# Patient Record
Sex: Female | Born: 1980 | Race: White | Hispanic: No | Marital: Single | Smoking: Former smoker
Health system: Southern US, Community
[De-identification: ages and names within clinical notes are randomized; demographics above are authoritative.]

## PROBLEM LIST (undated history)

## (undated) DIAGNOSIS — J45909 Unspecified asthma, uncomplicated: Secondary | ICD-10-CM

## (undated) DIAGNOSIS — R062 Wheezing: Secondary | ICD-10-CM

## (undated) DIAGNOSIS — R0600 Dyspnea, unspecified: Secondary | ICD-10-CM

## (undated) DIAGNOSIS — M1990 Unspecified osteoarthritis, unspecified site: Secondary | ICD-10-CM

## (undated) DIAGNOSIS — B029 Zoster without complications: Secondary | ICD-10-CM

## (undated) HISTORY — PX: KNEE ARTHROSCOPY W/ ACL RECONSTRUCTION: SHX1858

## (undated) HISTORY — PX: HERNIA REPAIR: SHX51

## (undated) HISTORY — PX: MENISECTOMY: SHX5181

## (undated) HISTORY — PX: TUBAL LIGATION: SHX77

---

## 2017-03-20 ENCOUNTER — Encounter (HOSPITAL_COMMUNITY): Payer: Self-pay | Admitting: Emergency Medicine

## 2017-03-20 ENCOUNTER — Emergency Department (HOSPITAL_COMMUNITY): Payer: Medicaid Other

## 2017-03-20 ENCOUNTER — Emergency Department (HOSPITAL_COMMUNITY)
Admission: EM | Admit: 2017-03-20 | Discharge: 2017-03-20 | Disposition: A | Discharge status: Home / Self Care | Payer: Medicaid Other | Attending: Emergency Medicine | Admitting: Emergency Medicine

## 2017-03-20 DIAGNOSIS — Y929 Unspecified place or not applicable: Secondary | ICD-10-CM | POA: Insufficient documentation

## 2017-03-20 DIAGNOSIS — S4991XA Unspecified injury of right shoulder and upper arm, initial encounter: Secondary | ICD-10-CM | POA: Diagnosis present

## 2017-03-20 DIAGNOSIS — Z91040 Latex allergy status: Secondary | ICD-10-CM | POA: Insufficient documentation

## 2017-03-20 DIAGNOSIS — S43401A Unspecified sprain of right shoulder joint, initial encounter: Secondary | ICD-10-CM | POA: Diagnosis not present

## 2017-03-20 DIAGNOSIS — Y9316 Activity, rowing, canoeing, kayaking, rafting and tubing: Secondary | ICD-10-CM | POA: Insufficient documentation

## 2017-03-20 DIAGNOSIS — J45909 Unspecified asthma, uncomplicated: Secondary | ICD-10-CM | POA: Insufficient documentation

## 2017-03-20 DIAGNOSIS — Y999 Unspecified external cause status: Secondary | ICD-10-CM | POA: Insufficient documentation

## 2017-03-20 DIAGNOSIS — W228XXA Striking against or struck by other objects, initial encounter: Secondary | ICD-10-CM | POA: Diagnosis not present

## 2017-03-20 DIAGNOSIS — Z79899 Other long term (current) drug therapy: Secondary | ICD-10-CM | POA: Diagnosis not present

## 2017-03-20 HISTORY — DX: Unspecified asthma, uncomplicated: J45.909

## 2017-03-20 MED ORDER — NAPROXEN 500 MG PO TABS: 500.0000 mg | ORAL_TABLET | Freq: Two times a day (BID) | ORAL | 0 refills | Status: DC

## 2017-03-20 MED ORDER — HYDROCODONE-ACETAMINOPHEN 5-325 MG PO TABS: 1.0000 | ORAL_TABLET | ORAL | 0 refills | Status: DC | PRN
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
## 2017-03-20 NOTE — Discharge Instructions (Signed)
Rest your shoulder as much as possible by using the sling provided.  However it is important to make small circles with your shoulder throughout the day to avoid stiffness.  Heat and ice as discussed.  Use the medications as prescribed.  You are taking naproxen in place of your diclofenac. You may take the hydrocodone prescribed for pain relief.  This will make you drowsy - do not drive within 4 hours of taking this medication.

## 2017-03-20 NOTE — ED Notes (Signed)
Pt made aware to return if symptoms worsen or if any life threatening symptoms occur.   
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
## 2017-03-28 ENCOUNTER — Encounter: Payer: Self-pay | Admitting: Orthopaedic Surgery

## 2017-03-28 ENCOUNTER — Ambulatory Visit (INDEPENDENT_AMBULATORY_CARE_PROVIDER_SITE_OTHER): Payer: Medicaid Other | Admitting: Orthopaedic Surgery

## 2017-03-28 VITALS — BP 114/70 | HR 77 | Temp 98.3°F | Ht 67.0 in | Wt 189.0 lb

## 2017-03-28 DIAGNOSIS — M25511 Pain in right shoulder: Secondary | ICD-10-CM | POA: Diagnosis not present

## 2017-03-28 MED ORDER — NAPROXEN 500 MG PO TABS: 500.0000 mg | ORAL_TABLET | Freq: Two times a day (BID) | ORAL | 5 refills | Status: DC

## 2017-03-28 MED ORDER — HYDROCODONE-ACETAMINOPHEN 5-325 MG PO TABS: ORAL_TABLET | ORAL | 0 refills | Status: DC
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
## 2017-04-11 ENCOUNTER — Encounter: Payer: Self-pay | Admitting: Orthopaedic Surgery

## 2017-04-11 ENCOUNTER — Ambulatory Visit (INDEPENDENT_AMBULATORY_CARE_PROVIDER_SITE_OTHER): Payer: Medicaid Other | Admitting: Orthopaedic Surgery

## 2017-04-11 VITALS — BP 124/80 | HR 67 | Temp 98.6°F | Ht 67.0 in | Wt 190.0 lb

## 2017-04-11 DIAGNOSIS — M25511 Pain in right shoulder: Secondary | ICD-10-CM
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
## 2017-05-01 ENCOUNTER — Ambulatory Visit (INDEPENDENT_AMBULATORY_CARE_PROVIDER_SITE_OTHER): Payer: Medicaid Other | Admitting: Orthopaedic Surgery

## 2017-05-01 ENCOUNTER — Encounter: Payer: Self-pay | Admitting: Orthopaedic Surgery

## 2017-05-01 DIAGNOSIS — G8929 Other chronic pain: Secondary | ICD-10-CM | POA: Diagnosis not present

## 2017-05-01 DIAGNOSIS — M25511 Pain in right shoulder: Secondary | ICD-10-CM | POA: Diagnosis not present

## 2017-05-01 NOTE — Progress Notes (Signed)
Patient Marcia Joseph, female DOB:1981-05-20, 36 y.o. [id number]  Chief Complaint  Patient presents with  . Follow-up    Right Shoulder pain    HPI  Marcia Joseph is a 36 y.o. female who has continued pain of the right shoulder.  She has no new trauma.  She is slightly better and has slightly better motion and no numbness but she still hurts and lacks full motion.  I will get MRI of the right shoulder.  I am concerned about rotator cuff tear. HPI  Body mass index is 30.23 kg/m.  ROS  Review of Systems  HENT: Negative for congestion.   Respiratory: Negative for cough and shortness of breath.   Cardiovascular: Negative for chest pain and leg swelling.  Endocrine: Negative for cold intolerance.  Musculoskeletal: Positive for arthralgias.  Allergic/Immunologic: Negative for environmental allergies.    Past Medical History:  Diagnosis Date  . Asthma     Past Surgical History:  Procedure Laterality Date  . HERNIA REPAIR    . KNEE ARTHROSCOPY W/ ACL RECONSTRUCTION Left   . TUBAL LIGATION      Family History  Problem Relation Age of Onset  . Heart disease Mother   . Cancer Father     Social History Social History  Substance Use Topics  . Smoking status: Never Smoker  . Smokeless tobacco: Never Used  . Alcohol use Yes     Comment: occ    Allergies  Allergen Reactions  . Latex Hives    Current Outpatient Prescriptions  Medication Sig Dispense Refill  . HYDROcodone-acetaminophen (NORCO/VICODIN) 5-325 MG tablet One tablet every four hours as needed for acute pain.  Limit of five days per [place]. 30 tablet 0  . naproxen (NAPROSYN) 500 MG tablet Take 1 tablet (500 mg total) by mouth 2 (two) times daily with a meal. 60 tablet 5   No current facility-administered medications for this visit.      Physical Exam  Blood pressure 123/84, pulse 72, temperature 99.2 F (37.3 C), height 5\' 7"  (1.702 m), weight 193 lb (87.5 kg).  Constitutional: overall normal  hygiene, normal nutrition, well developed, normal grooming, normal body habitus. Assistive device:none  Musculoskeletal: gait and station Limp none, muscle tone and strength are normal, no tremors or atrophy is present.  .  Neurological: coordination overall normal.  Deep tendon reflex/nerve stretch intact.  Sensation normal.  Cranial nerves II-XII intact.   Skin:   Normal overall no scars, lesions, ulcers or rashes. No psoriasis.  Psychiatric: Alert and oriented x 3.  Recent memory intact, remote memory unclear.  Normal mood and affect. Well groomed.  Good eye contact.  Cardiovascular: overall no swelling, no varicosities, no edema bilaterally, normal temperatures of the legs and arms, no clubbing, cyanosis and good capillary refill.  Lymphatic: palpation is normal.  Examination of right Upper Extremity is done.  Inspection:   Overall:  Elbow non-tender without crepitus or defects, forearm non-tender without crepitus or defects, wrist non-tender without crepitus or defects, hand non-tender.    Shoulder: with glenohumeral joint tenderness, without effusion.   Upper arm: without swelling and tenderness   Range of motion:   Overall:  Full range of motion of the elbow, full range of motion of wrist and full range of motion in fingers.   Shoulder:  right  165 degrees forward flexion; 150 degrees abduction; 30 degrees internal rotation, 30 degrees external rotation, 15 degrees extension, 40 degrees adduction.   Stability:   Overall:  Shoulder, elbow  and wrist stable   Strength and Tone:   Overall full shoulder muscles strength, full upper arm strength and normal upper arm bulk and tone.   The patient has been educated about the nature of the problem(s) and counseled on treatment options.  The patient appeared to understand what I have discussed and is in agreement with it.  No diagnosis found.  PLAN Call if any problems.  Precautions discussed.  Continue current medications.    Return to clinic after MRI of the right shoulder   Electronically Signed Darreld McleanWayne Tanith Dagostino, MD 9/4/20182:14 PM

## 2017-05-02 ENCOUNTER — Encounter: Payer: Self-pay | Admitting: Radiology

## 2017-05-04 ENCOUNTER — Ambulatory Visit (HOSPITAL_COMMUNITY): Payer: Medicaid Other

## 2017-05-07 ENCOUNTER — Ambulatory Visit (HOSPITAL_COMMUNITY)
Admission: RE | Admit: 2017-05-07 | Discharge: 2017-05-07 | Disposition: A | Discharge status: Home / Self Care | Payer: Medicaid Other | Source: Ambulatory Visit | Attending: Orthopaedic Surgery | Admitting: Orthopaedic Surgery

## 2017-05-07 DIAGNOSIS — M25551 Pain in right hip: Secondary | ICD-10-CM | POA: Insufficient documentation

## 2017-05-07 DIAGNOSIS — M948X1 Other specified disorders of cartilage, shoulder: Secondary | ICD-10-CM | POA: Diagnosis not present

## 2017-05-07 DIAGNOSIS — G8929 Other chronic pain: Secondary | ICD-10-CM | POA: Diagnosis present

## 2017-05-07 DIAGNOSIS — M25411 Effusion, right shoulder: Secondary | ICD-10-CM | POA: Diagnosis not present

## 2017-05-07 DIAGNOSIS — M25511 Pain in right shoulder: Secondary | ICD-10-CM

## 2017-05-10 ENCOUNTER — Encounter: Payer: Self-pay | Admitting: Orthopaedic Surgery

## 2017-05-10 ENCOUNTER — Ambulatory Visit (INDEPENDENT_AMBULATORY_CARE_PROVIDER_SITE_OTHER): Payer: Medicaid Other | Admitting: Orthopaedic Surgery

## 2017-05-10 VITALS — BP 116/78 | HR 84 | Temp 98.8°F | Ht 67.0 in | Wt 192.0 lb

## 2017-05-10 DIAGNOSIS — M25511 Pain in right shoulder: Secondary | ICD-10-CM | POA: Diagnosis not present

## 2017-05-10 DIAGNOSIS — M24011 Loose body in right shoulder: Secondary | ICD-10-CM | POA: Diagnosis not present

## 2017-05-10 DIAGNOSIS — G8929 Other chronic pain: Secondary | ICD-10-CM | POA: Diagnosis not present
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
## 2017-05-23 ENCOUNTER — Ambulatory Visit (INDEPENDENT_AMBULATORY_CARE_PROVIDER_SITE_OTHER): Payer: Self-pay | Admitting: Orthopedic Surgery

## 2017-05-24 ENCOUNTER — Ambulatory Visit (INDEPENDENT_AMBULATORY_CARE_PROVIDER_SITE_OTHER): Payer: Self-pay | Admitting: Orthopedic Surgery

## 2017-06-13 ENCOUNTER — Encounter (INDEPENDENT_AMBULATORY_CARE_PROVIDER_SITE_OTHER): Payer: Self-pay | Admitting: Orthopedic Surgery

## 2017-06-13 ENCOUNTER — Ambulatory Visit (INDEPENDENT_AMBULATORY_CARE_PROVIDER_SITE_OTHER): Payer: Medicaid Other | Admitting: Orthopedic Surgery

## 2017-06-13 DIAGNOSIS — M25511 Pain in right shoulder: Secondary | ICD-10-CM | POA: Diagnosis not present
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
## 2017-07-05 ENCOUNTER — Ambulatory Visit
Admission: RE | Admit: 2017-07-05 | Discharge: 2017-07-05 | Disposition: A | Discharge status: Home / Self Care | Payer: Medicaid Other | Source: Ambulatory Visit | Attending: Orthopedic Surgery | Admitting: Orthopedic Surgery

## 2017-07-05 DIAGNOSIS — M25511 Pain in right shoulder: Secondary | ICD-10-CM

## 2017-07-05 MED ORDER — IOPAMIDOL (ISOVUE-M 200) INJECTION 41%
17.0000 mL | Freq: Once | INTRAMUSCULAR | Status: AC
  Administered 2017-07-05: 17 mL via INTRA_ARTICULAR

## 2017-07-12 ENCOUNTER — Ambulatory Visit (INDEPENDENT_AMBULATORY_CARE_PROVIDER_SITE_OTHER): Payer: Medicaid Other | Admitting: Orthopedic Surgery

## 2017-07-23 ENCOUNTER — Encounter (INDEPENDENT_AMBULATORY_CARE_PROVIDER_SITE_OTHER): Payer: Self-pay | Admitting: Orthopedic Surgery

## 2017-07-23 ENCOUNTER — Ambulatory Visit (INDEPENDENT_AMBULATORY_CARE_PROVIDER_SITE_OTHER): Payer: Medicaid Other | Admitting: Orthopedic Surgery

## 2017-07-23 DIAGNOSIS — S43431D Superior glenoid labrum lesion of right shoulder, subsequent encounter: Secondary | ICD-10-CM

## 2017-07-23 NOTE — Progress Notes (Signed)
Office Visit Note   Patient: Marcia Joseph           Date of Birth: Jul 15, 1981           MRN: [id number] Visit Date: 07/23/2017 Requested by: Liz MaladyJimenez, Shannon R, DO [address] [place], [place] [address] PCP: Liz MaladyJimenez, Shannon R, DO  Subjective: Chief Complaint  Patient presents with  . Right Shoulder - Follow-up    HPI: He was a patient with right shoulder pain.  She had an injury in July.  She returns today after MRI arthrogram of the right shoulder.  This scan shows anterior inferior labral tear consistent with dislocation.  There is also a small chondral defect in the posterior superior humeral head consistent with Hill-Sachs lesion.  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  She does very well working below shoulder level but cannot work above shoulder level.  Brace helped her.  She does describe some instability for the first 4 weeks after this injury but is not having instability since.  She does report continued pain.  She also has a left thumb injury for which she is being evaluated by [place] Hydrograph[place].              ROS: All systems reviewed are negative as they relate to the chief complaint within the history of present illness.  Patient denies  fevers or chills.   Assessment & Plan: Visit Diagnoses:  1. Tear of right glenoid labrum, subsequent encounter     Plan: Impression is right shoulder pain with labral tear and Hill-Sachs lesion.  I think in this time the MRI arthrogram has confirmed that she had a shoulder dislocation in July.  She has had subsequent instability for 4 weeks following and is had pain since then as well.  I think it is likely but not definitive that the pain she is having is coming from occult shoulder instability.  Rotator cuff looked intact by the report but on my reading there may be anterior supraspinatus pathology consisting of partial tearing at least.  I think it is possible and more likely than not that we can help even by stabilizing her  shoulder.  She may lose some range of motion with that endeavor.  She asked me about rockclimbing after surgery and I don't think that is a good idea at all.  Those types of activities are inherently dangerous after shoulder stabilization.  Plan at this time is for her to see the hand surgeon in [place] that she can come up with a timetable for operative fixation of the thumb versus the shoulder.  She will call me once that decision has been made and I can posterior at that time.  We discussed the out of work time and the rehabilitation time required for this as well.  Follow-Up Instructions: No Follow-up on file.   Orders:  No orders of the defined types were placed in this encounter.  No orders of the defined types were placed in this encounter.     Procedures: No procedures performed   Clinical Data: No additional findings.  Objective: Vital Signs: There were no vitals taken for this visit.  Physical Exam:   Constitutional: Patient appears well-developed HEENT:  Head: Normocephalic Eyes:EOM are normal Neck: Normal range of motion Cardiovascular: Normal rate Pulmonary/chest: Effort normal Neurologic: Patient is alert Skin: Skin is warm Psychiatric: Patient has normal mood and affect    Ortho Exam: Orthopedic exam demonstrates good range of motion with  only small amount of crepitus with internal/external rotation 90 of abduction.  Rotator cuff strength is intact to infraspinatus super space and subscap testing on the right.  Motor sensory function is intact in the right hand and arm as well.  Neck range of motion is good.  I do get some semblance of positive apprehension with anterior testing but she has less than a centimeter sulcus sign.  Specialty Comments:  No specialty comments available.  Imaging: No results found.   PMFS History: There are no active problems to display for this patient.  Past Medical History:  Diagnosis Date  . Asthma     Family History    Problem Relation Age of Onset  . Heart disease Mother   . Cancer Father     Past Surgical History:  Procedure Laterality Date  . HERNIA REPAIR    . KNEE ARTHROSCOPY W/ ACL RECONSTRUCTION Left   . TUBAL LIGATION     Social History   Occupational History  . Not on file  Tobacco Use  . Smoking status: Never Smoker  . Smokeless tobacco: Never Used  Substance and Sexual Activity  . Alcohol use: Yes    Comment: occ  . Drug use: No  . Sexual activity: Not on file

## 2017-08-08 ENCOUNTER — Telehealth (INDEPENDENT_AMBULATORY_CARE_PROVIDER_SITE_OTHER): Payer: Self-pay

## 2017-08-08 NOTE — Telephone Encounter (Signed)
Received voice mail from patient inquiring about scheduling surgery.  I called patient and left voice mail for return call to discuss.

## 2017-08-14 NOTE — Telephone Encounter (Signed)
Patient called back today and insisted on speaking with scheduler.  As I was talking to her, her phone went dead.  I called her back and left a voice mail for a return call.

## 2017-09-28 ENCOUNTER — Telehealth (INDEPENDENT_AMBULATORY_CARE_PROVIDER_SITE_OTHER): Payer: Self-pay

## 2017-09-28 NOTE — Telephone Encounter (Signed)
Patient is scheduled for shoulder scope/labral repair on 10/04/17.  She has questions about post op needs.  She wants to know if she will be in a sling, what type of assistance she might need, what will be her restrictions, etc.  [phone]

## 2017-10-01 ENCOUNTER — Other Ambulatory Visit (INDEPENDENT_AMBULATORY_CARE_PROVIDER_SITE_OTHER): Payer: Self-pay | Admitting: Orthopedic Surgery

## 2017-10-01 DIAGNOSIS — M25311 Other instability, right shoulder: Secondary | ICD-10-CM

## 2017-10-01 NOTE — Telephone Encounter (Signed)
Please advise. Thanks.  

## 2017-10-01 NOTE — Telephone Encounter (Signed)
She will be in a sling for at least 3 weeks.  She will be able to really use that arm for much of anything.  She will be no lifting and no motion with that arm for the first 3 weeks after surgery essentially.

## 2017-10-01 NOTE — Telephone Encounter (Signed)
IC advised.  

## 2017-10-02 ENCOUNTER — Encounter (HOSPITAL_COMMUNITY)
Admission: RE | Admit: 2017-10-02 | Discharge: 2017-10-02 | Disposition: A | Discharge status: Home / Self Care | Payer: BLUE CROSS/BLUE SHIELD | Source: Ambulatory Visit | Attending: Orthopedic Surgery | Admitting: Orthopedic Surgery

## 2017-10-02 ENCOUNTER — Other Ambulatory Visit: Payer: Self-pay

## 2017-10-02 ENCOUNTER — Encounter (HOSPITAL_COMMUNITY): Payer: Self-pay

## 2017-10-02 DIAGNOSIS — Z01812 Encounter for preprocedural laboratory examination: Secondary | ICD-10-CM | POA: Insufficient documentation

## 2017-10-02 HISTORY — DX: Wheezing: R06.2

## 2017-10-02 HISTORY — DX: Zoster without complications: B02.9

## 2017-10-02 HISTORY — DX: Dyspnea, unspecified: R06.00

## 2017-10-02 HISTORY — DX: Unspecified osteoarthritis, unspecified site: M19.90

## 2017-10-02 LAB — CBC
HCT: 36.6 % (ref 36.0–46.0)
Hemoglobin: 12 g/dL (ref 12.0–15.0)
MCH: 31.3 pg (ref 26.0–34.0)
MCHC: 32.8 g/dL (ref 30.0–36.0)
MCV: 95.3 fL (ref 78.0–100.0)
PLATELETS: 296 10*3/uL (ref 150–400)
RBC: 3.84 MIL/uL — ABNORMAL LOW (ref 3.87–5.11)
RDW: 13.5 % (ref 11.5–15.5)
WBC: 11.6 10*3/uL — AB (ref 4.0–10.5)

## 2017-10-02 NOTE — Progress Notes (Addendum)
PATIENT STATED SHE HAS HAD SOME WHEEZING , SOB, NEEDED TO USE INHALER MORE FREQUENTLY LATELY.  PATIENT STATED SHE IS ALLERGIC TO THE CAT AND HAS JUST STARTED A NEW MEDICINE.  ENCOURAGED PATIENT TO CALL PHYSICIAN WHO PRESCRIBED NEW MED TO BE SURE SHE WAS NOT HAVING A REACTION TO IT. PATIENT DID NOT APPEAR TO BE IN ANY DISTRESS , NOR WHEEZING AT PAT APPOINTMENT.

## 2017-10-02 NOTE — Pre-Procedure Instructions (Signed)
Marcia Joseph  10/02/2017      [facility] - [place], [place] - [address] [place] [address] Phone: [phone] Fax: [phone]    Your procedure is scheduled on  Thursday 10/04/17  [facility] Admitting at 930 A.M.  Call this number if you have problems the morning of surgery:  [phone]   Remember:  Do not eat food or drink liquids after midnight.  Take these medicines the morning of surgery with A SIP OF WATER-  ALBUTEROL, NASAL SPRAY, XYZAL, BIRTH CONTROL  7 days prior to surgery STOP taking any Aspirin(unless otherwise instructed by your surgeon), Aleve, Naproxen, Ibuprofen, Motrin, Advil, Goody's, BC's, all herbal medications, fish oil, and all vitamins   Do not wear jewelry, make-up or nail polish.  Do not wear lotions, powders, or perfumes, or deodorant.  Do not shave 48 hours prior to surgery.  Men may shave face and neck.  Do not bring valuables to the hospital.  [facility] is not responsible for any belongings or valuables.  Contacts, dentures or bridgework may not be worn into surgery.  Leave your suitcase in the car.  After surgery it may be brought to your room.  For patients admitted to the hospital, discharge time will be determined by your treatment team.  Patients discharged the day of surgery will not be allowed to drive home.   Name and phone number of your driver:    Special instructions:  [place] - Preparing for Surgery  Before surgery, you can play an important role.  Because skin is not sterile, your skin needs to be as free of germs as possible.  You can reduce the number of germs on you skin by washing with CHG (chlorahexidine gluconate) soap before surgery.  CHG is an antiseptic cleaner which kills germs and bonds with the skin to continue killing germs even after washing.  Please DO NOT use if you have an allergy to CHG or antibacterial soaps.  If your skin becomes reddened/irritated stop  using the CHG and inform your nurse when you arrive at Short Stay.  Do not shave (including legs and underarms) for at least 48 hours prior to the first CHG shower.  You may shave your face.  Please follow these instructions carefully:   1.  Shower with CHG Soap the night before surgery and the                                morning of Surgery.  2.  If you choose to wash your hair, wash your hair first as usual with your       normal shampoo.  3.  After you shampoo, rinse your hair and body thoroughly to remove the                      Shampoo.  4.  Use CHG as you would any other liquid soap.  You can apply chg directly       to the skin and wash gently with scrungie or a clean washcloth.  5.  Apply the CHG Soap to your body ONLY FROM THE NECK DOWN.        Do not use on open wounds or open sores.  Avoid contact with your eyes,       ears, mouth and genitals (private parts).  Wash genitals (private parts)  with your normal soap.  6.  Wash thoroughly, paying special attention to the area where your surgery        will be performed.  7.  Thoroughly rinse your body with warm water from the neck down.  8.  DO NOT shower/wash with your normal soap after using and rinsing off       the CHG Soap.  9.  Pat yourself dry with a clean towel.            10.  Wear clean pajamas.            11.  Place clean sheets on your bed the night of your first shower and do not        sleep with pets.  Day of Surgery  Do not apply any lotions/deoderants the morning of surgery.  Please wear clean clothes to the hospital/surgery center.    Please read over the following fact sheets that you were given. Pain Booklet

## 2017-10-04 ENCOUNTER — Encounter (HOSPITAL_COMMUNITY)
Admission: RE | Disposition: A | Discharge status: Home / Self Care | Payer: Self-pay | Source: Ambulatory Visit | Attending: Orthopedic Surgery

## 2017-10-04 ENCOUNTER — Encounter (INDEPENDENT_AMBULATORY_CARE_PROVIDER_SITE_OTHER): Payer: Self-pay | Admitting: Orthopedic Surgery

## 2017-10-04 ENCOUNTER — Ambulatory Visit (HOSPITAL_COMMUNITY): Payer: Medicaid Other | Admitting: Certified Registered Nurse Anesthetist

## 2017-10-04 ENCOUNTER — Encounter (HOSPITAL_COMMUNITY): Payer: Self-pay | Admitting: Certified Registered Nurse Anesthetist

## 2017-10-04 ENCOUNTER — Ambulatory Visit (HOSPITAL_COMMUNITY): Payer: Medicaid Other | Admitting: Emergency Medicine

## 2017-10-04 ENCOUNTER — Ambulatory Visit (HOSPITAL_COMMUNITY)
Admission: RE | Admit: 2017-10-04 | Discharge: 2017-10-04 | Disposition: A | Discharge status: Home / Self Care | Payer: Medicaid Other | Source: Ambulatory Visit | Attending: Orthopedic Surgery | Admitting: Orthopedic Surgery

## 2017-10-04 DIAGNOSIS — Z79899 Other long term (current) drug therapy: Secondary | ICD-10-CM | POA: Insufficient documentation

## 2017-10-04 DIAGNOSIS — M25311 Other instability, right shoulder: Secondary | ICD-10-CM | POA: Insufficient documentation

## 2017-10-04 DIAGNOSIS — J45909 Unspecified asthma, uncomplicated: Secondary | ICD-10-CM | POA: Diagnosis not present

## 2017-10-04 DIAGNOSIS — Z87891 Personal history of nicotine dependence: Secondary | ICD-10-CM | POA: Insufficient documentation

## 2017-10-04 DIAGNOSIS — S43431A Superior glenoid labrum lesion of right shoulder, initial encounter: Secondary | ICD-10-CM

## 2017-10-04 HISTORY — PX: SHOULDER ARTHROSCOPY WITH LABRAL REPAIR: SHX5691

## 2017-10-04 LAB — POCT PREGNANCY, URINE: Preg Test, Ur: NEGATIVE

## 2017-10-04 SURGERY — ARTHROSCOPY, SHOULDER, WITH GLENOID LABRUM REPAIR
Anesthesia: General | Site: Shoulder | Laterality: Right

## 2017-10-04 MED ORDER — ALBUTEROL SULFATE HFA 108 (90 BASE) MCG/ACT IN AERS
INHALATION_SPRAY | RESPIRATORY_TRACT | Status: DC | PRN
  Administered 2017-10-04: 4 via RESPIRATORY_TRACT

## 2017-10-04 MED ORDER — 0.9 % SODIUM CHLORIDE (POUR BTL) OPTIME
TOPICAL | Status: DC | PRN
  Administered 2017-10-04: 1000 mL

## 2017-10-04 MED ORDER — BUPIVACAINE HCL (PF) 0.25 % IJ SOLN
INTRAMUSCULAR | Status: DC | PRN
  Administered 2017-10-04: 20 mL

## 2017-10-04 MED ORDER — ROPIVACAINE HCL 7.5 MG/ML IJ SOLN
INTRAMUSCULAR | Status: DC | PRN
  Administered 2017-10-04: 20 mL via PERINEURAL

## 2017-10-04 MED ORDER — CHLORHEXIDINE GLUCONATE 4 % EX LIQD: 60.0000 mL | Freq: Once | CUTANEOUS | Status: DC

## 2017-10-04 MED ORDER — ROCURONIUM BROMIDE 10 MG/ML (PF) SYRINGE
PREFILLED_SYRINGE | INTRAVENOUS | Status: DC | PRN
  Administered 2017-10-04 (×3): 20 mg via INTRAVENOUS
  Administered 2017-10-04: 60 mg via INTRAVENOUS

## 2017-10-04 MED ORDER — FENTANYL CITRATE (PF) 100 MCG/2ML IJ SOLN
50.0000 ug | Freq: Once | INTRAMUSCULAR | Status: AC
  Administered 2017-10-04: 50 ug via INTRAVENOUS

## 2017-10-04 MED ORDER — EPHEDRINE SULFATE-NACL 50-0.9 MG/10ML-% IV SOSY
PREFILLED_SYRINGE | INTRAVENOUS | Status: DC | PRN
  Administered 2017-10-04 (×2): 5 mg via INTRAVENOUS

## 2017-10-04 MED ORDER — FENTANYL CITRATE (PF) 250 MCG/5ML IJ SOLN
INTRAMUSCULAR | Status: AC
  Filled 2017-10-04: qty 5

## 2017-10-04 MED ORDER — MIDAZOLAM HCL 2 MG/2ML IJ SOLN
2.0000 mg | Freq: Once | INTRAMUSCULAR | Status: AC
Start: 2017-10-04 — End: 2017-10-04
  Administered 2017-10-04: 2 mg via INTRAVENOUS

## 2017-10-04 MED ORDER — LACTATED RINGERS IV SOLN
INTRAVENOUS | Status: DC
  Administered 2017-10-04: 11:00:00 via INTRAVENOUS

## 2017-10-04 MED ORDER — PROPOFOL 10 MG/ML IV BOLUS
INTRAVENOUS | Status: AC
  Filled 2017-10-04: qty 20

## 2017-10-04 MED ORDER — SODIUM CHLORIDE 0.9 % IJ SOLN
INTRAMUSCULAR | Status: DC | PRN
  Administered 2017-10-04: 30 mL
  Administered 2017-10-04: 20 mL

## 2017-10-04 MED ORDER — SODIUM CHLORIDE 0.9 % IR SOLN
Status: DC | PRN
  Administered 2017-10-04 (×4): 3000 mL

## 2017-10-04 MED ORDER — FENTANYL CITRATE (PF) 100 MCG/2ML IJ SOLN
INTRAMUSCULAR | Status: AC
  Filled 2017-10-04: qty 2

## 2017-10-04 MED ORDER — PROPOFOL 10 MG/ML IV BOLUS
INTRAVENOUS | Status: DC | PRN
  Administered 2017-10-04: 200 mg via INTRAVENOUS

## 2017-10-04 MED ORDER — CEFAZOLIN SODIUM-DEXTROSE 2-4 GM/100ML-% IV SOLN
2.0000 g | INTRAVENOUS | Status: AC
  Administered 2017-10-04: 2 g via INTRAVENOUS
  Filled 2017-10-04: qty 100

## 2017-10-04 MED ORDER — FENTANYL CITRATE (PF) 100 MCG/2ML IJ SOLN
INTRAMUSCULAR | Status: DC | PRN
  Administered 2017-10-04: 50 ug via INTRAVENOUS
  Administered 2017-10-04: 100 ug via INTRAVENOUS

## 2017-10-04 MED ORDER — BUPIVACAINE HCL (PF) 0.25 % IJ SOLN
INTRAMUSCULAR | Status: AC
  Filled 2017-10-04: qty 30

## 2017-10-04 MED ORDER — MIDAZOLAM HCL 2 MG/2ML IJ SOLN
INTRAMUSCULAR | Status: AC
  Filled 2017-10-04: qty 2

## 2017-10-04 MED ORDER — STERILE WATER FOR IRRIGATION IR SOLN
Status: DC | PRN
  Administered 2017-10-04: 1000 mL

## 2017-10-04 MED ORDER — ONDANSETRON HCL 4 MG/2ML IJ SOLN
INTRAMUSCULAR | Status: DC | PRN
  Administered 2017-10-04: 4 mg via INTRAVENOUS

## 2017-10-04 MED ORDER — SUGAMMADEX SODIUM 200 MG/2ML IV SOLN
INTRAVENOUS | Status: AC
  Filled 2017-10-04: qty 2

## 2017-10-04 MED ORDER — LIDOCAINE 2% (20 MG/ML) 5 ML SYRINGE
INTRAMUSCULAR | Status: DC | PRN
  Administered 2017-10-04: 60 mg via INTRAVENOUS

## 2017-10-04 MED ORDER — PHENYLEPHRINE 40 MCG/ML (10ML) SYRINGE FOR IV PUSH (FOR BLOOD PRESSURE SUPPORT)
PREFILLED_SYRINGE | INTRAVENOUS | Status: DC | PRN
  Administered 2017-10-04: 80 ug via INTRAVENOUS

## 2017-10-04 MED ORDER — SUGAMMADEX SODIUM 200 MG/2ML IV SOLN
INTRAVENOUS | Status: DC | PRN
  Administered 2017-10-04: 200 mg via INTRAVENOUS

## 2017-10-04 MED ORDER — EPHEDRINE 5 MG/ML INJ
INTRAVENOUS | Status: AC
  Filled 2017-10-04: qty 10

## 2017-10-04 MED ORDER — ONDANSETRON HCL 4 MG/2ML IJ SOLN
INTRAMUSCULAR | Status: AC
  Filled 2017-10-04: qty 2

## 2017-10-04 MED ORDER — EPINEPHRINE PF 1 MG/ML IJ SOLN
INTRAMUSCULAR | Status: DC | PRN
  Administered 2017-10-04: 1 mg

## 2017-10-04 MED ORDER — EPINEPHRINE PF 1 MG/ML IJ SOLN
INTRAMUSCULAR | Status: AC
  Filled 2017-10-04: qty 1

## 2017-10-04 MED ORDER — DEXTROSE 5 % IV SOLN
INTRAVENOUS | Status: DC | PRN
  Administered 2017-10-04: 25 ug/min via INTRAVENOUS

## 2017-10-04 MED ORDER — ROCURONIUM BROMIDE 10 MG/ML (PF) SYRINGE
PREFILLED_SYRINGE | INTRAVENOUS | Status: AC
  Filled 2017-10-04: qty 10

## 2017-10-04 MED ORDER — DEXAMETHASONE SODIUM PHOSPHATE 10 MG/ML IJ SOLN
INTRAMUSCULAR | Status: DC | PRN
  Administered 2017-10-04: 5 mg via INTRAVENOUS

## 2017-10-04 MED ORDER — PHENYLEPHRINE 40 MCG/ML (10ML) SYRINGE FOR IV PUSH (FOR BLOOD PRESSURE SUPPORT)
PREFILLED_SYRINGE | INTRAVENOUS | Status: AC
  Filled 2017-10-04: qty 10

## 2017-10-04 SURGICAL SUPPLY — 53 items
ANCHOR SUT BIOCOMP LK 2.9X12.5 (Anchor) ×9 IMPLANT
BLADE CUTTER GATOR 3.5 (BLADE) IMPLANT
BLADE GREAT WHITE 4.2 (BLADE) IMPLANT
BLADE GREAT WHITE 4.2MM (BLADE)
BLADE SURG 11 STRL SS (BLADE) ×3 IMPLANT
CANNULA 5.75X71 LONG (CANNULA) ×6 IMPLANT
CANNULA TWIST IN 8.25X7CM (CANNULA) ×3 IMPLANT
COVER SURGICAL LIGHT HANDLE (MISCELLANEOUS) ×3 IMPLANT
DRAPE INCISE IOBAN 66X45 STRL (DRAPES) ×3 IMPLANT
DRAPE STERI 35X30 U-POUCH (DRAPES) ×6 IMPLANT
DRSG TEGADERM 4X4.75 (GAUZE/BANDAGES/DRESSINGS) ×3 IMPLANT
DURAPREP 26ML APPLICATOR (WOUND CARE) ×3 IMPLANT
ELECT REM PT RETURN 9FT ADLT (ELECTROSURGICAL) ×3
ELECTRODE REM PT RTRN 9FT ADLT (ELECTROSURGICAL) ×1 IMPLANT
FIBERSTICK 2 (SUTURE) IMPLANT
GAUZE SPONGE 4X4 12PLY STRL (GAUZE/BANDAGES/DRESSINGS) ×3 IMPLANT
GAUZE SPONGE 4X4 12PLY STRL LF (GAUZE/BANDAGES/DRESSINGS) ×3 IMPLANT
GAUZE XEROFORM 1X8 LF (GAUZE/BANDAGES/DRESSINGS) ×3 IMPLANT
GLOVE BIOGEL PI IND STRL 8 (GLOVE) ×1 IMPLANT
GLOVE BIOGEL PI INDICATOR 8 (GLOVE) ×2
GLOVE SURG ORTHO 8.0 STRL STRW (GLOVE) ×3 IMPLANT
GOWN STRL REUS W/ TWL LRG LVL3 (GOWN DISPOSABLE) ×3 IMPLANT
GOWN STRL REUS W/TWL LRG LVL3 (GOWN DISPOSABLE) ×6
KIT BASIN OR (CUSTOM PROCEDURE TRAY) ×3 IMPLANT
KIT DISPOSABLE PUSHLOCK 2.9MM (KITS) IMPLANT
KIT PUSHLOCK 2.9 HIP (KITS) ×3 IMPLANT
KIT ROOM TURNOVER OR (KITS) ×3 IMPLANT
LASSO 90 CVE QUICKPAS (DISPOSABLE) ×3 IMPLANT
LASSO CRESCENT QUICKPASS (SUTURE) IMPLANT
MANIFOLD NEPTUNE II (INSTRUMENTS) ×3 IMPLANT
NEEDLE SPNL 18GX3.5 QUINCKE PK (NEEDLE) ×6 IMPLANT
NS IRRIG 1000ML POUR BTL (IV SOLUTION) ×3 IMPLANT
PACK SHOULDER (CUSTOM PROCEDURE TRAY) ×3 IMPLANT
PAD ARMBOARD 7.5X6 YLW CONV (MISCELLANEOUS) ×6 IMPLANT
SET ARTHROSCOPY TUBING (MISCELLANEOUS) ×2
SET ARTHROSCOPY TUBING LN (MISCELLANEOUS) ×1 IMPLANT
SLEEVE ARM SUSPENSION SYSTEM (MISCELLANEOUS) ×3 IMPLANT
SLING ARM IMMOBILIZER MED (SOFTGOODS) ×3 IMPLANT
SLING S3 LATERAL DISP (MISCELLANEOUS) ×3 IMPLANT
SPONGE LAP 4X18 X RAY DECT (DISPOSABLE) ×3 IMPLANT
SUT ETHILON 3 0 PS 1 (SUTURE) ×6 IMPLANT
SUT FIBERWIRE 2-0 18 17.9 3/8 (SUTURE)
SUT VIC AB 3-0 X1 27 (SUTURE) ×3 IMPLANT
SUTURE FIBERWR 2-0 18 17.9 3/8 (SUTURE) IMPLANT
SYR 20CC LL (SYRINGE) ×3 IMPLANT
SYR 30ML LL (SYRINGE) ×3 IMPLANT
TAPE LABRALWHITE 1.5X36 (TAPE) IMPLANT
TAPE SUT LABRALTAP WHT/BLK (SUTURE) ×9 IMPLANT
TOWEL OR 17X24 6PK STRL BLUE (TOWEL DISPOSABLE) ×3 IMPLANT
TOWEL OR 17X26 10 PK STRL BLUE (TOWEL DISPOSABLE) ×3 IMPLANT
WAND HAND CNTRL MULTIVAC 90 (MISCELLANEOUS) IMPLANT
WAND STAR VAC 90 (SURGICAL WAND) ×3 IMPLANT
WATER STERILE IRR 1000ML POUR (IV SOLUTION) ×3 IMPLANT

## 2017-10-04 NOTE — Anesthesia Preprocedure Evaluation (Addendum)
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 negative psych ROS   GI/Hepatic negative GI ROS, Neg liver ROS,   Endo/Other  Obesity  Renal/GU negative Renal ROS  negative genitourinary   Musculoskeletal  (+) Arthritis ,   Abdominal (+) + obese,   Peds  Hematology negative hematology ROS (+)   Anesthesia Other Findings   Reproductive/Obstetrics                            Anesthesia Physical Anesthesia Plan  ASA: II  Anesthesia Plan: General   Post-op Pain Management:  Regional for Post-op pain   Induction: Intravenous  PONV Risk Score and Plan: 3 and Treatment may vary due to age or medical condition, Midazolam, Dexamethasone and Ondansetron  Airway Management Planned: Oral ETT  Additional Equipment: None  Intra-op Plan:   Post-operative Plan: Extubation in OR  Informed Consent: I have reviewed the patients History and Physical, chart, labs and discussed the procedure including the risks, benefits and alternatives for the proposed anesthesia with the patient or authorized representative who has indicated his/her understanding and acceptance.   Dental advisory given  Plan Discussed with: CRNA  Anesthesia Plan Comments:         Anesthesia Quick Evaluation

## 2017-10-04 NOTE — Transfer of Care (Signed)
Immediate Anesthesia Transfer of Care Note  Patient: Marcia Joseph  Procedure(s) Performed: RIGHT SHOULDER ARTHROSCOPY WITH LABRAL REPAIR (Right Shoulder)  Patient Location: PACU  Anesthesia Type:GA combined with regional for post-op pain  Level of Consciousness: awake, alert , oriented and patient cooperative  Airway & Oxygen Therapy: Patient Spontanous Breathing  Post-op Assessment: Report given to RN, Post -op Vital signs reviewed and stable and Patient moving all extremities X 4  Post vital signs: Reviewed and stable  Last Vitals:  Vitals:   10/04/17 1115 10/04/17 1422  BP: 134/74 137/79  Pulse: 73 (!) 111  Resp: 15 20  Temp:    SpO2: 100% 94%    Last Pain:  Vitals:   10/04/17 1027  TempSrc:   PainSc: 0-No pain      Patients Stated Pain Goal: 3 (10/04/17 1027)  Complications: No apparent anesthesia complications

## 2017-10-04 NOTE — Anesthesia Postprocedure Evaluation (Signed)
Anesthesia Post Note  Patient: Marcia Joseph  Procedure(s) Performed: RIGHT SHOULDER ARTHROSCOPY WITH LABRAL REPAIR (Right Shoulder)     Patient location during evaluation: PACU Anesthesia Type: General Level of consciousness: awake and alert Pain management: pain level controlled Vital Signs Assessment: post-procedure vital signs reviewed and stable Respiratory status: spontaneous breathing, nonlabored ventilation and respiratory function stable Cardiovascular status: blood pressure returned to baseline and stable Postop Assessment: no apparent nausea or vomiting Anesthetic complications: no    Last Vitals:  Vitals:   10/04/17 1452 10/04/17 1508  BP: 124/75 113/73  Pulse: 100 95  Resp: 15 18  Temp:  36.8 C  SpO2: 95% 94%    Last Pain:  Vitals:   10/04/17 1027  TempSrc:   PainSc: 0-No pain                 Beryle Lathehomas E Dawn Convery

## 2017-10-04 NOTE — Anesthesia Procedure Notes (Signed)
Procedure Name: Intubation Date/Time: 10/04/2017 11:56 AM Performed by: Waynard EdwardsSmith, Tomicka Lover A, CRNA Pre-anesthesia Checklist: Patient identified, Emergency Drugs available, Suction available and Patient being monitored Patient Re-evaluated:Patient Re-evaluated prior to induction Oxygen Delivery Method: Circle system utilized Preoxygenation: Pre-oxygenation with 100% oxygen Induction Type: IV induction Ventilation: Mask ventilation without difficulty Laryngoscope Size: Miller and 2 Grade View: Grade I Tube type: Oral Tube size: 7.0 mm Number of attempts: 1 Airway Equipment and Method: Stylet Placement Confirmation: ETT inserted through vocal cords under direct vision,  positive ETCO2 and breath sounds checked- equal and bilateral Secured at: 23 cm Tube secured with: Tape Dental Injury: Teeth and Oropharynx as per pre-operative assessment

## 2017-10-04 NOTE — H&P (Signed)
Marcia Joseph is an 37 y.o. female.   Chief Complaint: Right shoulder pain HPI: Marcia Joseph is a 37 year old female with several month history of right shoulder pain.  She sustained an injury in 2018.  Subsequent workup demonstrated a Hill-Sachs lesion as well as stripping of the anterior capsule.  She reports continued pain with the shoulder particularly with overhead motion.  She does have symptoms of instability in the shoulder.  She has failed nonoperative management.  MRI scan does show anterior labral tearing as well as a Hill-Sachs lesion.  This does not appear to be an engaging Hill-Sachs lesion.  It is more of a chondral shear injury.  Past Medical History:  Diagnosis Date  . Arthritis   . Asthma   . Dyspnea    WHEEZING, WITH ASTHMA  . [facility]  . [facility]     Past Surgical History:  Procedure Laterality Date  . HERNIA REPAIR    . KNEE ARTHROSCOPY W/ ACL RECONSTRUCTION Left   . MENISECTOMY     LEFT KNEE   . TUBAL LIGATION      Family History  Problem Relation Age of Onset  . Heart disease Mother   . Cancer Father    Social History:  reports that she has quit smoking. she has never used smokeless tobacco. She reports that she drinks alcohol. She reports that she does not use drugs.  Allergies:  Allergies  Allergen Reactions  . Fexofenadine Anaphylaxis and Swelling    "COULD NOT SWALLOW"  . Ppd [Tuberculin Purified Protein Derivative] Shortness Of Breath and Rash    WHELPS TO ARM TO CHEST TROUBLE BREATHING AND SWALLOWING  . Latex Hives and Rash    Medications Prior to Admission  Medication Sig Dispense Refill  . albuterol (PROVENTIL HFA;VENTOLIN HFA) 108 (90 Base) MCG/ACT inhaler Inhale 2 puffs into the lungs every 6 (six) hours as needed for wheezing or shortness of breath.    . amphetamine-dextroamphetamine (ADDERALL XR) 30 MG 24 hr capsule Take 30 mg by mouth daily.    Marland Kitchen. azelastine (ASTELIN) 0.1 % nasal spray Place 1 spray into the nose 2 (two)  times daily.    . clindamycin-benzoyl peroxide (BENZACLIN) gel Apply 1 application topically daily.    . diclofenac (VOLTAREN) 75 MG EC tablet Take 75 mg by mouth 2 (two) times daily as needed for moderate pain.    Marland Kitchen. doxycycline (VIBRAMYCIN) 100 MG capsule Take 100 mg by mouth 2 (two) times daily.    Marland Kitchen. levocetirizine (XYZAL) 5 MG tablet Take 5 mg by mouth every evening.    Marland Kitchen. levonorgestrel-ethinyl estradiol (SEASONALE,INTROVALE,JOLESSA) 0.15-0.03 MG tablet Take 1 tablet by mouth daily.    Marland Kitchen. tiZANidine (ZANAFLEX) 4 MG tablet Take 4 mg by mouth at bedtime as needed for muscle spasms.    . Clobetasol Propionate 0.05 % shampoo Apply 1 application topically daily as needed.      Results for orders placed or performed during the hospital encounter of 10/04/17 (from the past 48 hour(s))  Pregnancy, urine POC     Status: None   Collection Time: 10/04/17 10:20 AM  Result Value Ref Range   Preg Test, Ur NEGATIVE NEGATIVE    Comment:        THE SENSITIVITY OF THIS METHODOLOGY IS >24 mIU/mL    No results found.  Review of Systems  Musculoskeletal: Positive for joint pain.  All other systems reviewed and are negative.   Blood pressure 134/74, pulse 73, temperature 98.3  F (36.8 C), temperature source Oral, resp. rate 15, height 5\' 6"  (1.676 m), weight 209 lb 11.2 oz (95.1 kg), SpO2 100 %. Physical Exam  Constitutional: She appears well-developed.  HENT:  Head: Normocephalic.  Eyes: Pupils are equal, round, and reactive to light.  Neck: Normal range of motion.  Cardiovascular: Normal rate.  Respiratory: Effort normal.  Neurological: She is alert.  Skin: Skin is warm.  Psychiatric: She has a normal mood and affect.    Examination of the right shoulder demonstrates good rotator cuff strength.  There is positive apprehension testing.  Less than a centimeter sulcus sign.  She has equivocal O'Brien's testing.  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  Plan is arthroscopy with labral repair.  Patient also has a humeral head defect from Hill-Sachs lesion.  That will be debrided as needed.  Risk and benefits of surgery are discussed including but not limited to infection nerve vessel damage recurrent instability as well as continued pain.  Expected rehabilitation time also discussed.  All questions answered  Burnard Bunting, MD 10/04/2017, 11:37 AM

## 2017-10-04 NOTE — Anesthesia Procedure Notes (Signed)
Anesthesia Regional Block: Interscalene brachial plexus block   Pre-Anesthetic Checklist: ,, timeout performed, Correct Patient, Correct Site, Correct Laterality, Correct Procedure, Correct Position, site marked, Risks and benefits discussed,  Surgical consent,  Pre-op evaluation,  At surgeon's request and post-op pain management  Laterality: Right  Prep: chloraprep       Needles:  Injection technique: Single-shot  Needle Type: Echogenic Needle     Needle Length: 5cm  Needle Gauge: 21     Additional Needles:   Narrative:  Start time: 10/04/2017 11:01 AM End time: 10/04/2017 11:05 AM Injection made incrementally with aspirations every 5 mL.  Performed by: Personally  Anesthesiologist: Beryle LatheBrock, Jaquavius Hudler E, MD  Additional Notes: No pain on injection. No increased resistance to injection. Injection made in 5cc increments. Good needle visualization. Patient tolerated the procedure well.

## 2017-10-04 NOTE — Brief Op Note (Signed)
10/04/2017  2:15 PM  PATIENT:  Marcia Joseph  37 y.o. female  PRE-OPERATIVE DIAGNOSIS:  right shoulder instability  POST-OPERATIVE DIAGNOSIS:  right shoulder instability  PROCEDURE:  Procedure(s): RIGHT SHOULDER ARTHROSCOPY WITH LABRAL REPAIR  SURGEON:  Surgeon(s): August Saucerean, Corrie MckusickGregory Scott, MD  ASSISTANT: Hart CarwinJustin queen rnfa  ANESTHESIA:   general  EBL: 2 ml    Total I/O In: -  Out: 50 [Blood:50]  BLOOD ADMINISTERED: none  DRAINS: none   LOCAL MEDICATIONS USED:  none  SPECIMEN:  No Specimen  COUNTS:  YES  TOURNIQUET:  * No tourniquets in log *  DICTATION: .Other Dictation: Dictation Number [id number]  PLAN OF CARE: Discharge to home after PACU  PATIENT DISPOSITION:  PACU - hemodynamically stable

## 2017-10-05 ENCOUNTER — Telehealth (INDEPENDENT_AMBULATORY_CARE_PROVIDER_SITE_OTHER): Payer: Self-pay | Admitting: Orthopedic Surgery

## 2017-10-05 ENCOUNTER — Encounter (HOSPITAL_COMMUNITY): Payer: Self-pay | Admitting: Orthopedic Surgery

## 2017-10-05 NOTE — Op Note (Signed)
NAME:  Orwick, Cabria                       ACCOUNT NO.:  MEDICAL RECORD NO.:  [id number]  LOCATION:                                 FACILITY:  PHYSICIAN:  Burnard Bunting, M.D.         DATE OF BIRTH:  DATE OF PROCEDURE: DATE OF DISCHARGE:                              OPERATIVE REPORT   PREOPERATIVE DIAGNOSIS:  Right shoulder anterior labral tear with instability.  POSTOPERATIVE DIAGNOSIS:  Right shoulder anterior labral tear with instability.  PROCEDURE:  Right shoulder arthroscopy with anterior labral repair.  SURGEON:  Burnard Bunting, M.D.  ASSISTANT:  Melvyn Neth, RNFA.  INDICATIONS:  Madia is a 37 year old patient, who injured her right shoulder and presents now for operative management.  She has sustained a dislocation and has had symptomatic instability and pain since that time.  FINDINGS:  Examination under anesthesia:  Range of motion, the patient had external rotation at 15 degrees of abduction to about 60 degrees. The patient had full forward flexion and full abduction on the right- hand side.  Shoulder stability 2+ anterior less than a centimeter of sulcus sign and 1+ posterior.  PROCEDURE IN DETAIL:  The patient was brought to the operating room where general endotracheal anesthesia was induced.  Preoperative antibiotics administered.  Time-out was called.  Right shoulder examined under anesthesia, found to have anterior laxity.  No multidirectional instability present.  The patient placed in the lateral position with the left axilla, left peroneal nerve well padded.  Arm was then suspended in about 30 degrees of traction and with a 15 pounds of traction along with superiorly directed traction.  Arm was prescrubbed with alcohol and Betadine, allowed to air dry, prepped with DuraPrep solution and draped in a sterile manner prior to positioning.  Once the Arthrex devices were placed, correct position was achieved.  The posterior portal was created 2 cm medial and  inferior to the posterolateral margin of the acromion.  Diagnostic arthroscopy was performed.  The patient did have anterior labral tearing along with cord- like middle glenohumeral ligament.  The rotator cuff was intact.  The patient had a healing Hill-Sachs lesion on the posterior superior aspect of the humeral head.  Anterior portal created under direct visualization.  Preparation of the glenoid rim from the 3 o'clock to 6 o'clock position was performed.  This was done with a periosteal elevator along with a red handle rasp.  After that was done, the [employer] suture passer was placed in the labral tissue approximately 5 mm from the rim.  Arthrex 2.9 mm suture loose PushLocks were placed at the 5 o'clock, 4 o'clock, and 3 o'clock positions.  Care was taken not to incorporate the middle glenohumeral ligament in to the repair. Following this, the patient's shoulder had very good stability. Superior labrum was intact.  Thorough irrigation was performed of the joint and the instruments were removed. Portals were closed using 3-0 Vicryl and 3-0 nylon.  Watertight dressings placed along with a shoulder immobilizer.  The patient tolerated the procedure well without immediate complication and transferred to the recovery room in stable condition.  Burnard BuntingG. Scott Dean, M.D.     GSD/MEDQ  D:  10/04/2017  T:  10/04/2017  Job:  [id number]

## 2017-10-05 NOTE — Telephone Encounter (Signed)
Discussed this with patient previously but IC her back and advised would discuss at OV and she would transition to P,T per Dr Alfonso Patteneans advisement.  She also reported having severe pain.  I advised her to ice and use advil/anti-inflammatory between doses of oxycodone.

## 2017-10-05 NOTE — Telephone Encounter (Signed)
Patient is wondering if she will be having physical therapy or if this will be discussed at her post op appointment. Please advise # [phone]

## 2017-10-17 ENCOUNTER — Ambulatory Visit (INDEPENDENT_AMBULATORY_CARE_PROVIDER_SITE_OTHER): Payer: BLUE CROSS/BLUE SHIELD | Admitting: Orthopedic Surgery

## 2017-10-17 ENCOUNTER — Encounter (INDEPENDENT_AMBULATORY_CARE_PROVIDER_SITE_OTHER): Payer: Self-pay | Admitting: Orthopedic Surgery

## 2017-10-17 DIAGNOSIS — Z9889 Other specified postprocedural states: Secondary | ICD-10-CM

## 2017-10-17 MED ORDER — OXYCODONE HCL 5 MG PO TABS: ORAL_TABLET | ORAL | 0 refills | Status: AC

## 2017-10-17 NOTE — Progress Notes (Signed)
Post-Op Visit Note   Patient: Marcia Joseph           Date of Birth: March 02, 1981           MRN: [id number] Visit Date: 10/17/2017 PCP: Liz Malady, DO   Assessment & Plan:  Chief Complaint:  Chief Complaint  Patient presents with  . Right Shoulder - Routine Post Op   Visit Diagnoses:  1. S/P arthroscopy of right shoulder     Plan: Patient presents 2 weeks out right shoulder arthroscopy and labral repair.  AGAINST MEDICAL ADVICE and instructions she discontinued use of her sling on Saturday.  She states her arm feels better out of the sling.  She is not taking any medication but had been taking some oxycodone.  She is concerned about a knot on the medial aspect of her forearm.  On examination the shoulder range of motion below 90 degrees of abduction feels smooth without grinding or crepitus.  Incisions are intact and sutures are removed.  Deltoid fires.  She does have a tender spot on the proximal aspect of the forearm flexor pronator mass.  It feels like it is a thrombosed vein.  There is no tenderness or symptoms above the medial epicondyle.  Impression is patient is now 2 weeks out from right shoulder arthroscopy and labral repair.  She is not a smoker but I did counsel her as to the importance of not using the arm.  I do not think she is going to get back into a sling although that is what is I would recommend for at least another week.  Do not want her doing any lifting or using the arm really in any fashion because it that tendon to bone interface needs more time to heal before stressing shoulder.  I will see her back in 3 weeks.  We will likely start her formal therapy range of motion at that time but for now since she is not really going to use the sling I do not want her really doing anything with the arm.  I do want to get an ultrasound on that right upper extremity just to evaluate that thrombosed vein.  I do not think it is going to require any treatment other than eventually  hot compresses and 2 baby aspirin a day for several weeks.  Follow-Up Instructions: Return in about 3 weeks (around 11/07/2017).   Orders:  No orders of the defined types were placed in this encounter.  Meds ordered this encounter  Medications  . oxyCODONE (ROXICODONE) 5 MG immediate release tablet    Sig: 1 PO Q 6-8HRS PRN PAIN    Dispense:  30 tablet    Refill:  0    Imaging: No results found.  PMFS History: There are no active problems to display for this patient.  Past Medical History:  Diagnosis Date  . Arthritis   . Asthma   . Dyspnea    WHEEZING, WITH ASTHMA  . Shingles outbreak    [facility]  2018  . Wheezing     Family History  Problem Relation Age of Onset  . Heart disease Mother   . Cancer Father     Past Surgical History:  Procedure Laterality Date  . HERNIA REPAIR    . KNEE ARTHROSCOPY W/ ACL RECONSTRUCTION Left   . MENISECTOMY     LEFT KNEE   . SHOULDER ARTHROSCOPY WITH LABRAL REPAIR Right 10/04/2017   Procedure: RIGHT SHOULDER ARTHROSCOPY WITH LABRAL REPAIR;  Surgeon: Rise Paganini  Lorin PicketScott, MD;  Location: [facility] OR;  Service: Orthopedics;  Laterality: Right;  . TUBAL LIGATION     Social History   Occupational History  . Not on file  Tobacco Use  . Smoking status: Former Games developermoker  . Smokeless tobacco: Never Used  Substance and Sexual Activity  . Alcohol use: Yes    Comment: occ  . Drug use: No  . Sexual activity: Not on file

## 2017-10-18 ENCOUNTER — Ambulatory Visit (HOSPITAL_COMMUNITY): Admission: RE | Admit: 2017-10-18 | Payer: BLUE CROSS/BLUE SHIELD | Source: Ambulatory Visit

## 2017-10-18 ENCOUNTER — Telehealth (INDEPENDENT_AMBULATORY_CARE_PROVIDER_SITE_OTHER): Payer: Self-pay | Admitting: *Deleted

## 2017-10-18 NOTE — Telephone Encounter (Signed)
Pt [facility] at 2:00pm, pt is to arrive at 145 to register. Left message on vm to return call for appt information.

## 2017-10-19 ENCOUNTER — Telehealth (INDEPENDENT_AMBULATORY_CARE_PROVIDER_SITE_OTHER): Payer: Self-pay | Admitting: *Deleted

## 2017-10-19 NOTE — Telephone Encounter (Signed)
IC and sw [facility]up[facility] at [facility] and pt is scheduled [facility] Feb [facility]th [facility] at 11am.

## 2017-10-19 NOTE — Telephone Encounter (Signed)
Received call from Lupita LeashDonna at [facility] informing me that pt was a NO SHOW to her appt yesterday at 2.  I called pt left vm to return call to see if we can try to get her scheduled again. Pending call back

## 2017-10-19 NOTE — Telephone Encounter (Signed)
Received vm from pt yesterday stating she is returning my call but stated she had informed everyone at front desk and assistant that she would be OOT on Thursday and Friday and she could not do any appts on these days to please reschedule and call her back with appt. Pt phone [phone]

## 2017-10-22 ENCOUNTER — Ambulatory Visit (HOSPITAL_COMMUNITY): Admission: RE | Admit: 2017-10-22 | Payer: BLUE CROSS/BLUE SHIELD | Source: Ambulatory Visit

## 2017-11-07 ENCOUNTER — Encounter (INDEPENDENT_AMBULATORY_CARE_PROVIDER_SITE_OTHER): Payer: Self-pay | Admitting: Orthopedic Surgery

## 2017-11-07 ENCOUNTER — Ambulatory Visit (INDEPENDENT_AMBULATORY_CARE_PROVIDER_SITE_OTHER): Payer: BLUE CROSS/BLUE SHIELD | Admitting: Orthopedic Surgery

## 2017-11-07 DIAGNOSIS — S43431D Superior glenoid labrum lesion of right shoulder, subsequent encounter: Secondary | ICD-10-CM

## 2017-11-07 MED ORDER — METHOCARBAMOL 500 MG PO TABS: 500.0000 mg | ORAL_TABLET | Freq: Three times a day (TID) | ORAL | 0 refills | Status: AC | PRN

## 2017-11-07 MED ORDER — OXYCODONE-ACETAMINOPHEN 5-325 MG PO TABS: ORAL_TABLET | ORAL | 0 refills | Status: AC

## 2017-11-10 ENCOUNTER — Encounter (INDEPENDENT_AMBULATORY_CARE_PROVIDER_SITE_OTHER): Payer: Self-pay | Admitting: Orthopedic Surgery

## 2017-11-10 NOTE — Progress Notes (Signed)
Post-Op Visit Note   Patient: Marcia Joseph           Date of Birth: Sep 28, 1980           MRN: [id number] Visit Date: 11/07/2017 PCP: Liz MaladyJimenez, Shannon R, DO   Assessment & Plan:  Chief Complaint:  Chief Complaint  Patient presents with  . Right Shoulder - Routine Post Op   Visit Diagnoses:  1. Tear of right glenoid labrum, subsequent encounter     Plan: Marcia Joseph is a patient who is now 5 weeks out right shoulder labral repair.  Never went for the right upper extremity Doppler for that forearm pain because the not improved.  She is taking pain pills at night and diclofenac during the day.  Also taking and I would recommend that she start to take 81 mg aspirin twice a day.  On examination she has good passive range of motion of that right shoulder.  Still a little bit stiff which is where I would like it.  That forearm tenderness does not feel as bad as it did last time.  I do not detect any cords or evidence of above focal thrombosis.  Like for her to take baby aspirin twice a day.  Discontinue use of the sling in 1 week but continue to use it until then when she is out of the house.  Started physical therapy.  Op note sent.  Robaxin and Percocet refilled.  Follow-up in 6 weeks for clinical recheck.  I do not think she needs that right upper extremity Doppler because there is really no focal cordlike thrombosis palpable in that forearm region and certainly not extending above the elbow.  Follow-Up Instructions: Return in about 6 weeks (around 12/19/2017).   Orders:  No orders of the defined types were placed in this encounter.  Meds ordered this encounter  Medications  . oxyCODONE-acetaminophen (PERCOCET/ROXICET) 5-325 MG tablet    Sig: 1 po q 8 hrs prn    Dispense:  40 tablet    Refill:  0  . methocarbamol (ROBAXIN) 500 MG tablet    Sig: Take 1 tablet (500 mg total) by mouth every 8 (eight) hours as needed for muscle spasms.    Dispense:  30 tablet    Refill:  0    Imaging: No  results found.  PMFS History: There are no active problems to display for this patient.  Past Medical History:  Diagnosis Date  . Arthritis   . Asthma   . Dyspnea    WHEEZING, WITH ASTHMA  . Shingles outbreak    [facility]  2018  . Wheezing     Family History  Problem Relation Age of Onset  . Heart disease Mother   . Cancer Father     Past Surgical History:  Procedure Laterality Date  . HERNIA REPAIR    . KNEE ARTHROSCOPY W/ ACL RECONSTRUCTION Left   . MENISECTOMY     LEFT KNEE   . SHOULDER ARTHROSCOPY WITH LABRAL REPAIR Right 10/04/2017   Procedure: RIGHT SHOULDER ARTHROSCOPY WITH LABRAL REPAIR;  Surgeon: Cammy Copaean, Gregory Scott, MD;  [facility];  Service: Orthopedics;  Laterality: Right;  . TUBAL LIGATION     Social History   Occupational History  . Not on file  Tobacco Use  . Smoking status: Former Games developermoker  . Smokeless tobacco: Never Used  Substance and Sexual Activity  . Alcohol use: Yes    Comment: occ  . Drug use: No  . Sexual activity: Not on  file

## 2017-12-21 ENCOUNTER — Ambulatory Visit (INDEPENDENT_AMBULATORY_CARE_PROVIDER_SITE_OTHER): Payer: BLUE CROSS/BLUE SHIELD | Admitting: Orthopedic Surgery

## 2018-03-25 ENCOUNTER — Telehealth (INDEPENDENT_AMBULATORY_CARE_PROVIDER_SITE_OTHER): Payer: Self-pay | Admitting: Orthopedic Surgery

## 2018-03-25 NOTE — Telephone Encounter (Signed)
I received VM from patient requesting copy of records.I called her back and LMVM advising copy is ready and that she will need to sign AR form when she comes to pick up

## 2018-06-30 IMAGING — XA DG FLUORO GUIDE NDL PLC/BX
2 series · 2 of 2 positions shown · IV contrast (multihance)
Comparison: none

CLINICAL DATA: Right shoulder pain.

EXAM:
RIGHT SHOULDER INJECTION UNDER FLUOROSCOPY
TECHNIQUE: An appropriate skin entrance site was determined. The site was
marked, prepped with Betadine, draped in the usual sterile fashion,
and infiltrated locally with 1% lidocaine. A 3.5 inch 22 gauge
spinal needle was advanced to the superomedial margin of the humeral
head under intermittent fluoroscopy. 1 mL of 1% lidocaine injected
easily. A mixture of 0.05 mL of MultiHance, 5 mL of 1% lidocaine,
and 15 mL of Isovue-M 200 was then used to opacify the right
shoulder capsule. The initial injection was not convincingly
intra-articular, with the coracoid process partially interfering
with a rotator interval approach and resulting in at least some
extra-articular contrast. A different approach was then used,
targeting the joint at the level of the junction of the middle and
inferior thirds of the glenoid. Injection at this location was
clearly intra-articular, and the remainder of the contrast mixture
was administered. No immediate complication.
FLUOROSCOPY TIME:  Radiation Exposure Index (if provided by the
fluoroscopic device): 74.72 microGray*m^2
Number of Acquired Spot Images: 0

[Series 1: ortho standard · 1 of 1 slices shown (1 of 2)]
[im 1/1]
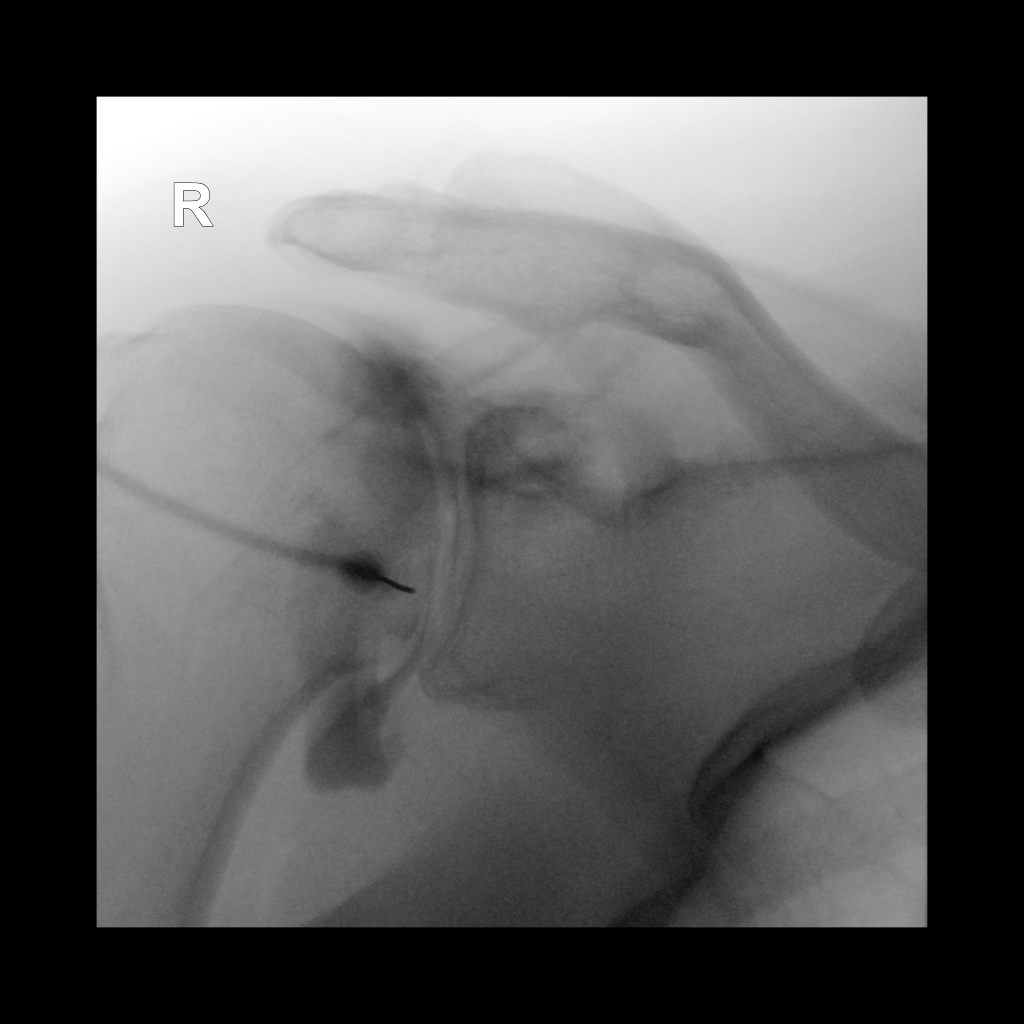

[Series 2: ortho standard · 1 of 1 slices shown (2 of 2)]
[im 1/1]
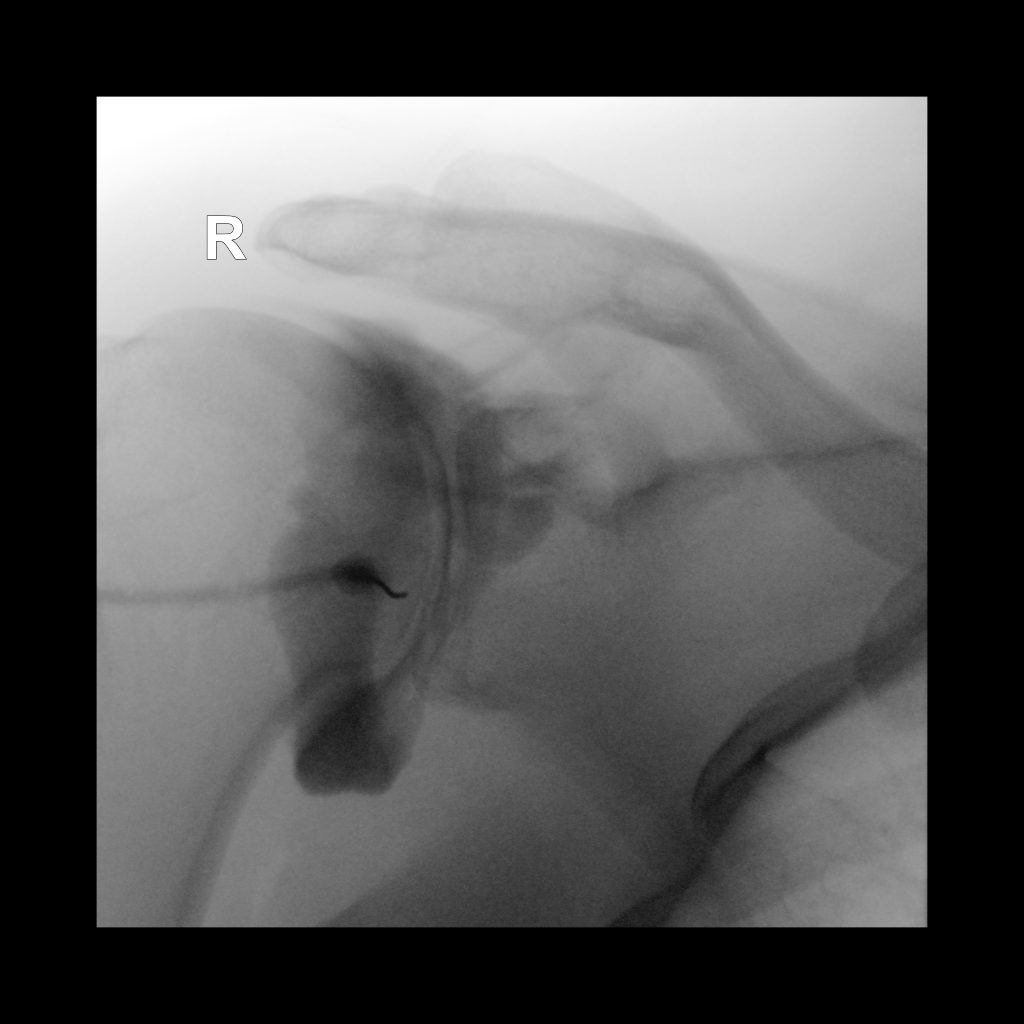

[2 of 2 positions shown; findings below may reference images not displayed]

IMPRESSION: Technically successful right shoulder injection for MRI.
